# Patient Record
Sex: Female | Born: 2000 | Race: White | Hispanic: No | Marital: Single | Smoking: Never smoker
Health system: Southern US, Community
[De-identification: ages and names within clinical notes are randomized; demographics above are authoritative.]

## PROBLEM LIST (undated history)

## (undated) DIAGNOSIS — J45909 Unspecified asthma, uncomplicated: Secondary | ICD-10-CM

---
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
## 2020-03-03 IMAGING — DX DG CHEST 2V
2 series · 2 of 2 positions shown · non-contrast
Comparison: None.

CLINICAL DATA: Cough and fever

EXAM:
CHEST - 2 VIEW

[chest pa]
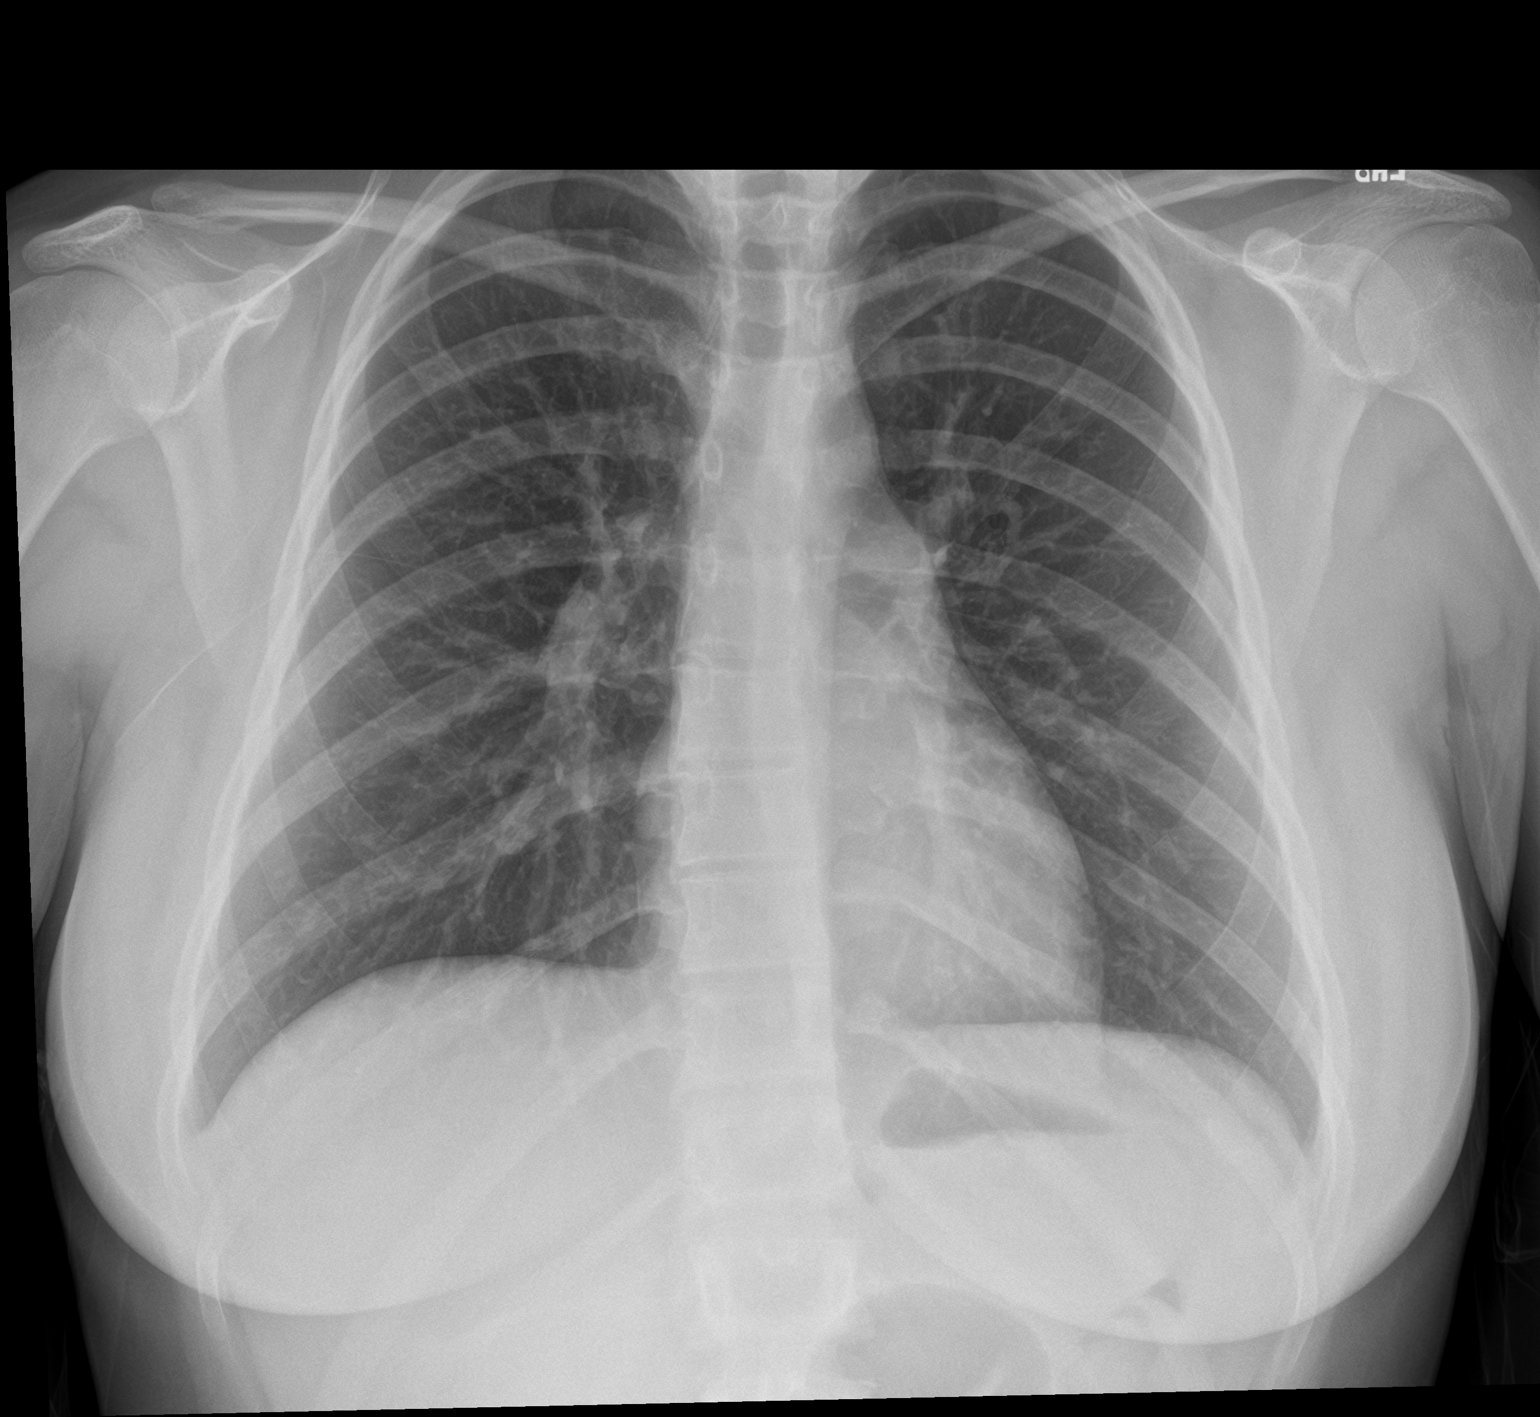

[chest lat]
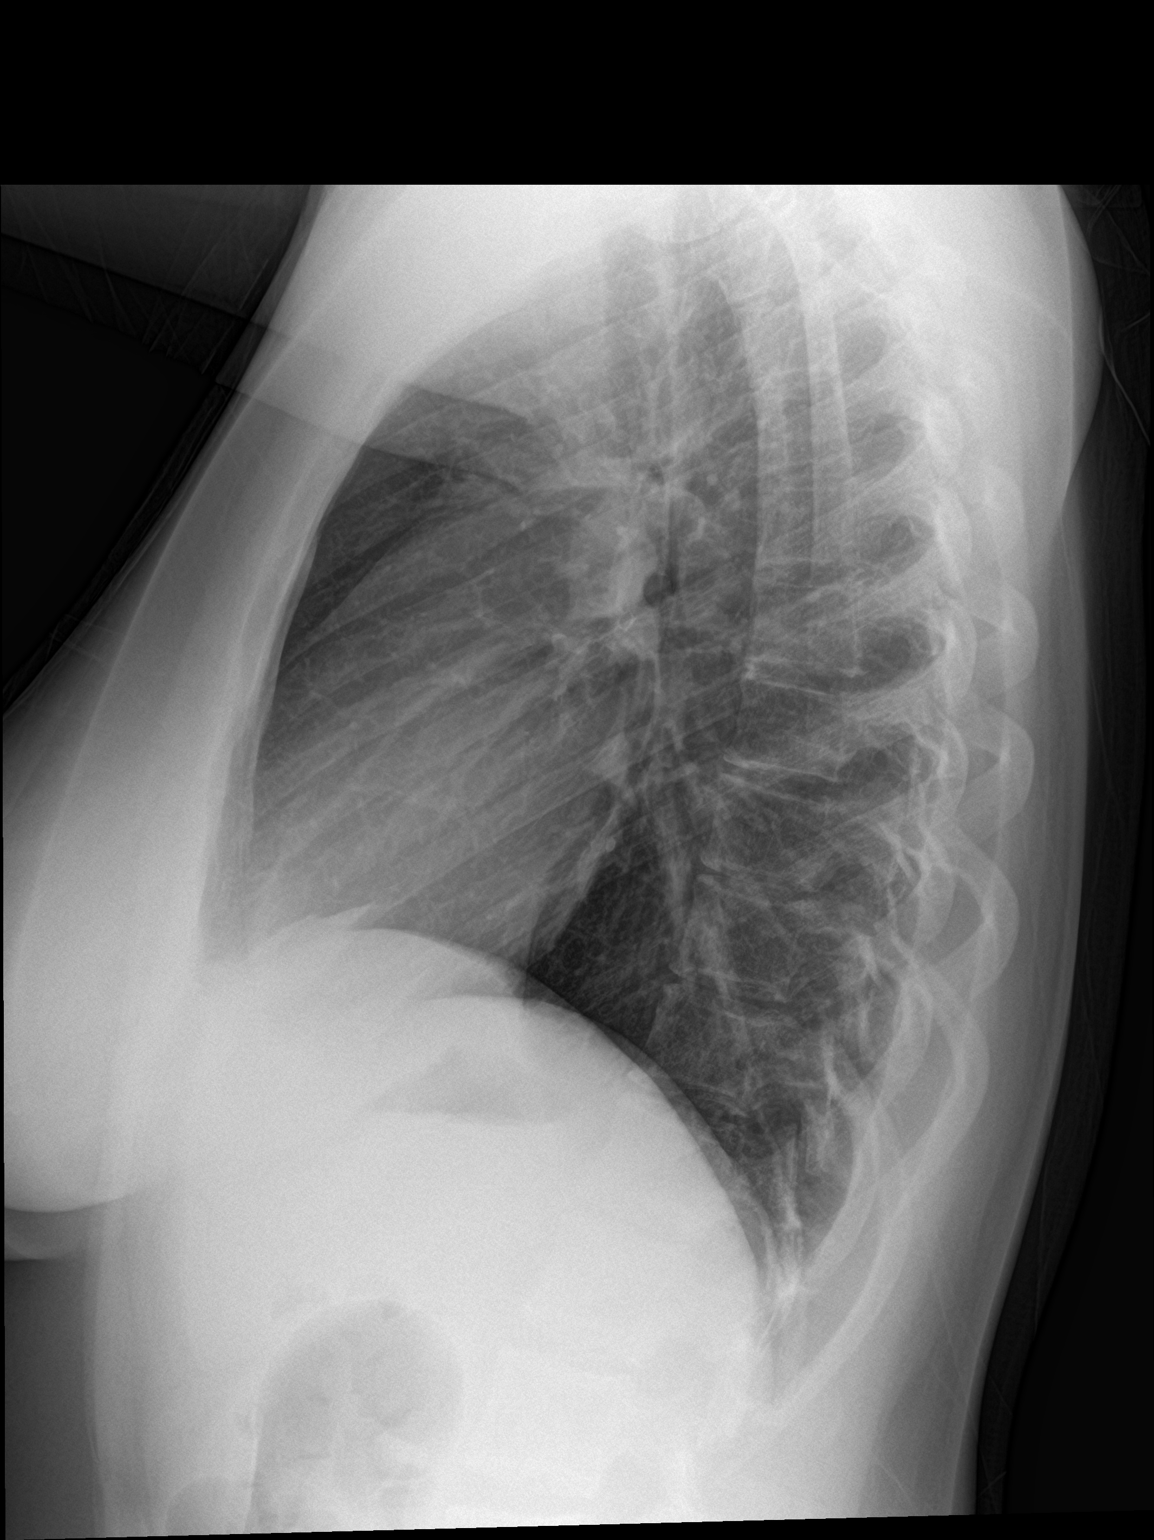

[2 of 2 positions shown; findings below may reference images not displayed]

FINDINGS: The lungs are clear. The heart size and pulmonary vascularity are
normal. No adenopathy. No bone lesions.
IMPRESSION: No edema or consolidation.
